# Patient Record
Sex: Female | Born: 1935 | Race: White | Hispanic: No | Smoking: Former smoker
Health system: Southern US, Community
[De-identification: ages and names within clinical notes are randomized; demographics above are authoritative.]

## PROBLEM LIST (undated history)

## (undated) DIAGNOSIS — Z950 Presence of cardiac pacemaker: Secondary | ICD-10-CM

## (undated) DIAGNOSIS — I509 Heart failure, unspecified: Secondary | ICD-10-CM

## (undated) DIAGNOSIS — C801 Malignant (primary) neoplasm, unspecified: Secondary | ICD-10-CM

## (undated) DIAGNOSIS — I4891 Unspecified atrial fibrillation: Secondary | ICD-10-CM

## (undated) DIAGNOSIS — E079 Disorder of thyroid, unspecified: Secondary | ICD-10-CM

## (undated) DIAGNOSIS — I10 Essential (primary) hypertension: Secondary | ICD-10-CM

## (undated) DIAGNOSIS — C50919 Malignant neoplasm of unspecified site of unspecified female breast: Secondary | ICD-10-CM

## (undated) DIAGNOSIS — Z923 Personal history of irradiation: Secondary | ICD-10-CM

## (undated) HISTORY — PX: BREAST SURGERY: SHX581

## (undated) HISTORY — PX: JOINT REPLACEMENT: SHX530

---

## 2008-05-27 HISTORY — PX: BREAST LUMPECTOMY: SHX2

## 2018-05-27 HISTORY — PX: BREAST BIOPSY: SHX20

## 2020-07-02 ENCOUNTER — Other Ambulatory Visit: Payer: Self-pay

## 2020-07-02 ENCOUNTER — Encounter (HOSPITAL_BASED_OUTPATIENT_CLINIC_OR_DEPARTMENT_OTHER): Payer: Self-pay | Admitting: Emergency Medicine

## 2020-07-02 ENCOUNTER — Emergency Department (HOSPITAL_BASED_OUTPATIENT_CLINIC_OR_DEPARTMENT_OTHER): Payer: Medicare Other

## 2020-07-02 ENCOUNTER — Observation Stay (HOSPITAL_BASED_OUTPATIENT_CLINIC_OR_DEPARTMENT_OTHER)
Admission: EM | Admit: 2020-07-02 | Discharge: 2020-07-02 | Disposition: A | Discharge status: Home / Self Care | Payer: Medicare Other | Attending: Cardiology | Admitting: Cardiology

## 2020-07-02 DIAGNOSIS — Z7982 Long term (current) use of aspirin: Secondary | ICD-10-CM | POA: Diagnosis not present

## 2020-07-02 DIAGNOSIS — Y710 Diagnostic and monitoring cardiovascular devices associated with adverse incidents: Secondary | ICD-10-CM | POA: Insufficient documentation

## 2020-07-02 DIAGNOSIS — Z79899 Other long term (current) drug therapy: Secondary | ICD-10-CM | POA: Insufficient documentation

## 2020-07-02 DIAGNOSIS — T82897A Other specified complication of cardiac prosthetic devices, implants and grafts, initial encounter: Secondary | ICD-10-CM | POA: Diagnosis present

## 2020-07-02 DIAGNOSIS — Z87891 Personal history of nicotine dependence: Secondary | ICD-10-CM | POA: Diagnosis not present

## 2020-07-02 DIAGNOSIS — R42 Dizziness and giddiness: Secondary | ICD-10-CM | POA: Insufficient documentation

## 2020-07-02 DIAGNOSIS — I110 Hypertensive heart disease with heart failure: Secondary | ICD-10-CM | POA: Insufficient documentation

## 2020-07-02 DIAGNOSIS — I455 Other specified heart block: Principal | ICD-10-CM

## 2020-07-02 DIAGNOSIS — R001 Bradycardia, unspecified: Secondary | ICD-10-CM | POA: Diagnosis not present

## 2020-07-02 DIAGNOSIS — T829XXA Unspecified complication of cardiac and vascular prosthetic device, implant and graft, initial encounter: Secondary | ICD-10-CM

## 2020-07-02 DIAGNOSIS — Z7901 Long term (current) use of anticoagulants: Secondary | ICD-10-CM | POA: Insufficient documentation

## 2020-07-02 DIAGNOSIS — I509 Heart failure, unspecified: Secondary | ICD-10-CM | POA: Insufficient documentation

## 2020-07-02 DIAGNOSIS — Z20822 Contact with and (suspected) exposure to covid-19: Secondary | ICD-10-CM | POA: Diagnosis not present

## 2020-07-02 DIAGNOSIS — I480 Paroxysmal atrial fibrillation: Secondary | ICD-10-CM | POA: Diagnosis not present

## 2020-07-02 HISTORY — DX: Malignant (primary) neoplasm, unspecified: C80.1

## 2020-07-02 HISTORY — DX: Unspecified atrial fibrillation: I48.91

## 2020-07-02 HISTORY — DX: Presence of cardiac pacemaker: Z95.0

## 2020-07-02 HISTORY — DX: Essential (primary) hypertension: I10

## 2020-07-02 HISTORY — DX: Disorder of thyroid, unspecified: E07.9

## 2020-07-02 HISTORY — DX: Heart failure, unspecified: I50.9

## 2020-07-02 LAB — CBC WITH DIFFERENTIAL/PLATELET
Abs Immature Granulocytes: 0.03 10*3/uL (ref 0.00–0.07)
Basophils Absolute: 0.1 10*3/uL (ref 0.0–0.1)
Basophils Relative: 1 %
Eosinophils Absolute: 0.2 10*3/uL (ref 0.0–0.5)
Eosinophils Relative: 1 %
HCT: 40.8 % (ref 36.0–46.0)
Hemoglobin: 14 g/dL (ref 12.0–15.0)
Immature Granulocytes: 0 %
Lymphocytes Relative: 17 %
Lymphs Abs: 1.7 10*3/uL (ref 0.7–4.0)
MCH: 31.7 pg (ref 26.0–34.0)
MCHC: 34.3 g/dL (ref 30.0–36.0)
MCV: 92.3 fL (ref 80.0–100.0)
Monocytes Absolute: 1 10*3/uL (ref 0.1–1.0)
Monocytes Relative: 9 %
Neutro Abs: 7.5 10*3/uL (ref 1.7–7.7)
Neutrophils Relative %: 72 %
Platelets: 162 10*3/uL (ref 150–400)
RBC: 4.42 MIL/uL (ref 3.87–5.11)
RDW: 13.4 % (ref 11.5–15.5)
WBC: 10.4 10*3/uL (ref 4.0–10.5)
nRBC: 0 % (ref 0.0–0.2)

## 2020-07-02 LAB — COMPREHENSIVE METABOLIC PANEL
ALT: 29 U/L (ref 0–44)
AST: 41 U/L (ref 15–41)
Albumin: 4.6 g/dL (ref 3.5–5.0)
Alkaline Phosphatase: 222 U/L — ABNORMAL HIGH (ref 38–126)
Anion gap: 11 (ref 5–15)
BUN: 24 mg/dL — ABNORMAL HIGH (ref 8–23)
CO2: 25 mmol/L (ref 22–32)
Calcium: 8.7 mg/dL — ABNORMAL LOW (ref 8.9–10.3)
Chloride: 99 mmol/L (ref 98–111)
Creatinine, Ser: 0.81 mg/dL (ref 0.44–1.00)
GFR, Estimated: >60 mL/min (ref >60)
Glucose, Bld: 107 mg/dL — ABNORMAL HIGH (ref 70–99)
Potassium: 3.9 mmol/L (ref 3.5–5.1)
Sodium: 135 mmol/L (ref 135–145)
Total Bilirubin: 0.9 mg/dL (ref 0.3–1.2)
Total Protein: 7.5 g/dL (ref 6.5–8.1)

## 2020-07-02 LAB — PROTIME-INR
INR: 1.3 — ABNORMAL HIGH (ref 0.8–1.2)
Prothrombin Time: 15.8 seconds — ABNORMAL HIGH (ref 11.4–15.2)

## 2020-07-02 LAB — TROPONIN I (HIGH SENSITIVITY)
Troponin I (High Sensitivity): 15 ng/L (ref <18)
Troponin I (High Sensitivity): 19 ng/L — ABNORMAL HIGH (ref <18)

## 2020-07-02 LAB — SARS CORONAVIRUS 2 BY RT PCR (HOSPITAL ORDER, PERFORMED IN ~~LOC~~ HOSPITAL LAB): SARS Coronavirus 2: NEGATIVE

## 2020-07-02 LAB — BRAIN NATRIURETIC PEPTIDE: B Natriuretic Peptide: 680.3 pg/mL — ABNORMAL HIGH (ref 0.0–100.0)

## 2020-07-02 MED ORDER — MAGNESIUM OXIDE 400 (241.3 MG) MG PO TABS: 400.0000 mg | ORAL_TABLET | Freq: Every day | ORAL | Status: DC

## 2020-07-02 MED ORDER — FLUTICASONE PROPIONATE 50 MCG/ACT NA SUSP: 1.0000 | Freq: Every day | NASAL | Status: DC | PRN

## 2020-07-02 MED ORDER — ATROPINE SULFATE 1 MG/10ML IJ SOSY
0.2500 mg | PREFILLED_SYRINGE | Freq: Once | INTRAMUSCULAR | Status: AC
  Administered 2020-07-02: 0.25 mg via INTRAVENOUS

## 2020-07-02 MED ORDER — PANTOPRAZOLE SODIUM 40 MG PO TBEC: 40.0000 mg | DELAYED_RELEASE_TABLET | Freq: Every day | ORAL | Status: DC

## 2020-07-02 MED ORDER — ALBUTEROL SULFATE HFA 108 (90 BASE) MCG/ACT IN AERS: 1.0000 | INHALATION_SPRAY | Freq: Four times a day (QID) | RESPIRATORY_TRACT | Status: DC | PRN

## 2020-07-02 MED ORDER — VITAMIN E 45 MG (100 UNIT) PO CAPS
400.0000 [IU] | ORAL_CAPSULE | Freq: Every day | ORAL | Status: DC
  Filled 2020-07-02: qty 4

## 2020-07-02 MED ORDER — ASPIRIN 81 MG PO CHEW: 81.0000 mg | CHEWABLE_TABLET | Freq: Every day | ORAL | Status: DC

## 2020-07-02 MED ORDER — ZOLPIDEM TARTRATE 5 MG PO TABS: 5.0000 mg | ORAL_TABLET | Freq: Every evening | ORAL | Status: DC | PRN

## 2020-07-02 MED ORDER — LORAZEPAM 1 MG PO TABS: 0.5000 mg | ORAL_TABLET | Freq: Every day | ORAL | Status: DC | PRN

## 2020-07-02 MED ORDER — CALCIUM CARBONATE-VITAMIN D 500-200 MG-UNIT PO TABS
1.0000 | ORAL_TABLET | Freq: Two times a day (BID) | ORAL | Status: DC
  Filled 2020-07-02: qty 1

## 2020-07-02 MED ORDER — HEPARIN (PORCINE) 25000 UT/250ML-% IV SOLN
750.0000 [IU]/h | INTRAVENOUS | Status: DC
  Filled 2020-07-02: qty 250

## 2020-07-02 MED ORDER — ATORVASTATIN CALCIUM 10 MG PO TABS: 20.0000 mg | ORAL_TABLET | Freq: Every day | ORAL | Status: DC

## 2020-07-02 MED ORDER — ONDANSETRON HCL 4 MG/2ML IJ SOLN: 4.0000 mg | Freq: Four times a day (QID) | INTRAMUSCULAR | Status: DC | PRN

## 2020-07-02 MED ORDER — LEVOTHYROXINE SODIUM 50 MCG PO TABS
50.0000 ug | ORAL_TABLET | Freq: Every day | ORAL | Status: DC
  Administered 2020-07-02: 50 ug via ORAL
  Filled 2020-07-02: qty 1

## 2020-07-02 MED ORDER — LEVOTHYROXINE SODIUM 50 MCG PO TABS: 50.0000 ug | ORAL_TABLET | Freq: Every day | ORAL | Status: DC

## 2020-07-02 MED ORDER — VITAMIN B-12 100 MCG PO TABS: 100.0000 ug | ORAL_TABLET | ORAL | Status: DC

## 2020-07-02 MED ORDER — ATROPINE SULFATE 1 MG/10ML IJ SOSY
PREFILLED_SYRINGE | INTRAMUSCULAR | Status: AC
  Administered 2020-07-02: 0.5 mg
  Filled 2020-07-02: qty 10

## 2020-07-02 MED ORDER — ATROPINE SULFATE 1 MG/10ML IJ SOSY: 0.5000 mg | PREFILLED_SYRINGE | Freq: Once | INTRAMUSCULAR | Status: AC

## 2020-07-02 MED ORDER — ACETAMINOPHEN 325 MG PO TABS: 650.0000 mg | ORAL_TABLET | ORAL | Status: DC | PRN

## 2020-07-02 MED ORDER — GLUCAGON HCL RDNA (DIAGNOSTIC) 1 MG IJ SOLR
1.0000 mg | Freq: Once | INTRAMUSCULAR | Status: AC
  Administered 2020-07-02: 1 mg via INTRAVENOUS
  Filled 2020-07-02: qty 1

## 2020-07-02 NOTE — ED Provider Notes (Addendum)
[facility] EMERGENCY DEPARTMENT Provider Note   CSN: [id number] Arrival date & time: 07/02/20  0027     History Chief Complaint  Patient presents with  . Pacemaker Problem    Rebecca Torres is a 87 y.o. female.  The history is provided by the patient.  Illness Location:  Pacermaker  Quality:  Missed beats since Friday.  Patient reports she initiatally had this same issue prior to pacer placement in 2013, but it restarted Friday.  Then eased up and this am restarted frequently.   Severity:  Severe Onset quality:  Gradual Duration:  2 days Timing:  Intermittent Progression:  Worsening Chronicity:  Recurrent Context:  Biotronic pacemaker placed in [place], [place] in 2013  Relieved by:  Nothing  Worsened by:  Time  Ineffective treatments:  None tried  Associated symptoms: fatigue   Associated symptoms: no abdominal pain, no chest pain, no congestion, no cough, no diarrhea, no ear pain, no fever, no headaches, no rash, no rhinorrhea, no shortness of breath, no sore throat and no vomiting   Associated symptoms comment:  Fatigue with pauses  Risk factors:  Elderly patient with pacemaker  Patient with AFIB on Eliquis and a pacemaker since 2013 presents with pauses in her pulse since Friday.  No trauma. No associated CP or SOB, but feels fatigued and poorly when the pauses happen.  Patient reports she is from [place] but lived in [place], [place] for the last 68 years and has recently moved to [place].       Past Medical History:  Diagnosis Date  . A-fib ([place])   . Cancer (HCC)    breast  . CHF (congestive heart failure) ([place])   . Hypertension   . Pacemaker   . Thyroid disease     There are no problems to display for this patient.   Past Surgical History:  Procedure Laterality Date  . BREAST SURGERY Left    lumpectomy  . JOINT REPLACEMENT     left knee     OB History   No obstetric history on file.     History reviewed. No pertinent family  history.  Social History   Tobacco Use  . Smoking status: Former Research scientist (life sciences)  . Smokeless tobacco: Never Used  Substance Use Topics  . Alcohol use: Never  . Drug use: Never    Home Medications Prior to Admission medications   Medication Sig Start Date End Date Taking? Authorizing Provider  atorvastatin (LIPITOR) 20 MG tablet Take 20 mg by mouth daily. 06/19/20   [provider]  ELIQUIS 2.5 MG TABS tablet Take 2.5 mg by mouth 2 (two) times daily. 06/19/20   [provider]  eszopiclone (LUNESTA) 2 MG TABS tablet Take 2 mg by mouth at bedtime as needed. 05/16/20   [provider]  furosemide (LASIX) 20 MG tablet Take 20 mg by mouth 2 (two) times daily. 03/30/20   [provider]  levothyroxine (SYNTHROID) 50 MCG tablet  05/08/20   [provider]  LORazepam (ATIVAN) 0.5 MG tablet Take 0.5 mg by mouth daily as needed. 03/09/20   [provider]  metoprolol succinate (TOPROL-XL) 50 MG 24 hr tablet Take 50 mg by mouth 2 (two) times daily. 05/30/20   [provider]  omeprazole (PRILOSEC) 20 MG capsule Take 20 mg by mouth daily. 03/09/20   [provider]  spironolactone (ALDACTONE) 25 MG tablet Take 25 mg by mouth daily. 03/30/20   [provider]    Allergies  Codeine  Review of Systems   Review of Systems  Constitutional: Positive for fatigue. Negative for fever.  HENT: Negative for congestion, ear pain, rhinorrhea and sore throat.   Respiratory: Negative for cough and shortness of breath.   Cardiovascular: Negative for chest pain.       Sinus pauses, pauses in pacing   Gastrointestinal: Negative for abdominal pain, diarrhea and vomiting.  Genitourinary: Negative for difficulty urinating.  Musculoskeletal: Negative for neck pain.  Skin: Negative for rash.  Neurological: Negative for headaches.  Psychiatric/Behavioral: Negative for agitation.  All other systems reviewed and are negative.   Physical  Exam Updated Vital Signs BP (!) 171/94   Pulse 89   Temp 98.3 F (36.8 C) (Oral)   Resp 19   Ht 5' 2.5" (1.588 m)   Wt 53.5 kg   SpO2 97%   BMI 21.24 kg/m   Physical Exam Vitals and nursing note reviewed.  Constitutional:      Appearance: Normal appearance. She is not diaphoretic.  HENT:     Head: Normocephalic and atraumatic.     Nose: Nose normal.  Eyes:     Extraocular Movements: Extraocular movements intact.     Conjunctiva/sclera: Conjunctivae normal.  Cardiovascular:     Rate and Rhythm: Normal rate and regular rhythm.     Pulses: Normal pulses.     Heart sounds: Normal heart sounds.  Pulmonary:     Effort: Pulmonary effort is normal.     Breath sounds: Normal breath sounds.  Abdominal:     General: Abdomen is flat. Bowel sounds are normal.     Palpations: Abdomen is soft.     Tenderness: There is no abdominal tenderness. There is no guarding.  Musculoskeletal:        General: Normal range of motion.     Cervical back: Normal range of motion and neck supple.  Skin:    General: Skin is warm and dry.     Capillary Refill: Capillary refill takes less than 2 seconds.  Neurological:     General: No focal deficit present.     Mental Status: She is alert and oriented to person, place, and time.     Deep Tendon Reflexes: Reflexes normal.  Psychiatric:        Mood and Affect: Mood normal.        Behavior: Behavior normal.        Thought Content: Thought content normal.     ED Results / Procedures / Treatments   Labs (all labs ordered are listed, but only abnormal results are displayed) Results for orders placed or performed during the hospital encounter of 07/02/20  CBC with Differential/Platelet  Result Value Ref Range   WBC 10.4 4.0 - 10.5 K/uL   RBC 4.42 3.87 - 5.11 MIL/uL   Hemoglobin 14.0 12.0 - 15.0 g/dL   HCT 40.8 36.0 - 46.0 %   MCV 92.3 80.0 - 100.0 fL   MCH 31.7 26.0 - 34.0 pg   MCHC 34.3 30.0 - 36.0 g/dL   RDW 13.4 11.5 - 15.5 %   Platelets 162  150 - 400 K/uL   nRBC 0.0 0.0 - 0.2 %   Neutrophils Relative % 72 %   Neutro Abs 7.5 1.7 - 7.7 K/uL   Lymphocytes Relative 17 %   Lymphs Abs 1.7 0.7 - 4.0 K/uL   Monocytes Relative 9 %   Monocytes Absolute 1.0 0.1 - 1.0 K/uL   Eosinophils Relative 1 %   Eosinophils Absolute 0.2  0.0 - 0.5 K/uL   Basophils Relative 1 %   Basophils Absolute 0.1 0.0 - 0.1 K/uL   Immature Granulocytes 0 %   Abs Immature Granulocytes 0.03 0.00 - 0.07 K/uL  Comprehensive metabolic panel  Result Value Ref Range   Sodium 135 135 - 145 mmol/L   Potassium 3.9 3.5 - 5.1 mmol/L   Chloride 99 98 - 111 mmol/L   CO2 25 22 - 32 mmol/L   Glucose, Bld 107 (H) 70 - 99 mg/dL   BUN 24 (H) 8 - 23 mg/dL   Creatinine, Ser 0.81 0.44 - 1.00 mg/dL   Calcium 8.7 (L) 8.9 - 10.3 mg/dL   Total Protein 7.5 6.5 - 8.1 g/dL   Albumin 4.6 3.5 - 5.0 g/dL   AST 41 15 - 41 U/L   ALT 29 0 - 44 U/L   Alkaline Phosphatase 222 (H) 38 - 126 U/L   Total Bilirubin 0.9 0.3 - 1.2 mg/dL   GFR, Estimated >60 >60 mL/min   Anion gap 11 5 - 15  Protime-INR  Result Value Ref Range   Prothrombin Time 15.8 (H) 11.4 - 15.2 seconds   INR 1.3 (H) 0.8 - 1.2  Troponin I (High Sensitivity)  Result Value Ref Range   Troponin I (High Sensitivity) 15 <18 ng/L   DG Chest Portable 1 View  Result Date: 07/02/2020 CLINICAL DATA:  Pacemaker issue EXAM: PORTABLE CHEST 1 VIEW COMPARISON:  10/14/2019 FINDINGS: Right pacer remains in place, unchanged. Cardiomegaly. Aortic atherosclerosis. No confluent opacities or effusions. No acute bony abnormality. IMPRESSION: Cardiomegaly.  No active disease. Electronically Signed   By: Rolm Baptise M.D.   On: 07/02/2020 01:22    EKG EKG Interpretation  Date/Time:  Sunday July 02 2020 00:33:09 EST Ventricular Rate:  93 PR Interval:    QRS Duration: 168 QT Interval:  438 QTC Calculation: 545 R Axis:   -83 Text Interpretation: Accelerated junctional rhythm IVCD, consider atypical RBBB LVH with secondary  repolarization abnormality Confirmed by Yeny Schmoll ([id number]) on 07/02/2020 12:39:05 AM prior to noted pauses in the ED   Radiology DG Chest Portable 1 View  Result Date: 07/02/2020 CLINICAL DATA:  Pacemaker issue EXAM: PORTABLE CHEST 1 VIEW COMPARISON:  10/14/2019 FINDINGS: Right pacer remains in place, unchanged. Cardiomegaly. Aortic atherosclerosis. No confluent opacities or effusions. No acute bony abnormality. IMPRESSION: Cardiomegaly.  No active disease. Electronically Signed   By: Kevin  Dover M.D.   On: 07/02/2020 01:22    Procedures Procedures   Medications Ordered in ED Medications  atropine 1 MG/10ML injection 0.5 mg (0.5 mg Intravenous Given 07/02/20 0122)    ED Course  I have reviewed the triage vital signs and the nursing notes.  Pertinent labs & imaging results that were available during my care of the patient were reviewed by me and considered in my medical decision making (see chart for details).   Patient presented to the ED for pauses in pacing.  During evaluation, patient see on monitor to go into a prolonged pause without p waves or pacer spikes.  Cardiology consulted and Decision made to given 0.5 mg of atropine with marked increase in rate to the 90s. Patient then began to pace but lost capture with every other beat.  There are no electrolyte abnormalities.  No signs of lead fracture on imaging.  First troponin is normal.  Copies of chart have been made strips.     12 0 Case d/w Cardiology fellow, Dr. Shirlee Latch by phone, no beds.  Will transfer  ED to ED.  Please page cardiology on Arrival   MDM Reviewed: nursing note and vitals Interpretation: labs, ECG and x-ray (elevated glucose, normal potassium, BUN and creatinine, normal first troponin, Cardiomegaly without lead fracture by me on CXR ) Total time providing critical care: 30-74 minutes (time at the bedside, atropine due to pauses ). This excludes time spent performing separately reportable procedures and  services. Consults: cardiology (EDP at [facility] )  CRITICAL CARE Performed by: Reilynn Lauro K Ruddy Swire-Rasch Total critical care time: 60 minutes Critical care time was exclusive of separately billable procedures and treating other patients. Critical care was necessary to treat or prevent imminent or life-threatening deterioration. Critical care was time spent personally by me on the following activities: development of treatment plan with patient and/or surrogate as well as nursing, discussions with consultants, evaluation of patient's response to treatment, examination of patient, obtaining history from patient or surrogate, ordering and performing treatments and interventions, ordering and review of laboratory studies, ordering and review of radiographic studies, pulse oximetry and re-evaluation of patient's condition.  Rebecca Torres was evaluated in Emergency Department on 07/02/2020 for the symptoms described in the history of present illness. She was evaluated in the context of the global COVID-19 pandemic, which necessitated consideration that the patient might be at risk for infection with the SARS-CoV-2 virus that causes COVID-19. Institutional protocols and algorithms that pertain to the evaluation of patients at risk for COVID-19 are in a state of rapid change based on information released by regulatory bodies including the CDC and federal and state organizations. These policies and algorithms were followed during the patient's care in the ED.   Given .25 mg of atropine prior to transport in order to maintain HR en route to [facility].  [facility] advised to go Emergency traffic [facility] by EDP and expressed understanding of this.   Final Clinical Impression(s) / ED Diagnoses Final diagnoses:  Sinus pause   Accepted by Dr. Cassandria Anger to the ED at [facility].  Cardiology to be paged upon arrival        [facility], Renezmae Canlas, MD 07/02/20 [id number]

## 2020-07-02 NOTE — ED Notes (Signed)
EDP ordered [facility] travel emergency traffic

## 2020-07-02 NOTE — ED Notes (Signed)
Verified with Dr that patient ok to discharge.

## 2020-07-02 NOTE — ED Notes (Signed)
EDP ordered acuity level increase

## 2020-07-02 NOTE — ED Notes (Signed)
EDP at bedside  

## 2020-07-02 NOTE — ED Notes (Signed)
Pt daughter at bedside, informed of transfer to [facility] ED; verbalized knowledge of location

## 2020-07-02 NOTE — ED Notes (Signed)
Pacer pads placed per EDP 

## 2020-07-02 NOTE — H&P (Addendum)
Cardiology Admission History and Physical:   Patient ID: Rebecca Torres MRN: [id number]; DOB: June 03, 1929   Admission date: 07/02/2020  Primary Care Provider: [place], [place] Primary Cardiologist: No primary care provider on file.  Primary Electrophysiologist:  None   Chief Complaint:  lightheadedness x 3 days  Patient Profile:   Rebecca Torres is a 87 y.o. female with history of pacemaker for sinus node dysfunction per patient in 2013, paroxysmal atrial fibrillation, relative recent diagnosis in the last 2 months of heart failure.  History of Present Illness:   Rebecca Torres is a pleasant 87 year old lady presenting with lightheadedness and dizziness and irregular beats since 2 or 3 days ago.  She states she does not normally check her heart rate at home and does not know what it meant however when she went to get checked at the ED at [facility] emergency department she was found to have a slow heart rate in the 40s concerning for junctional rhythm with wide QRS complex.  There was reported long pauses as long as 8 and 10 seconds and she did receive atropine x2 with good rapid responses.  She had transcutaneous pacing pads placed but never was paced reportedly.  Otherwise she is doing well without complaint.  At the bedside he is denying any shortness of breath lower extremity edema or recurrent dizziness.   Past Medical History:  Diagnosis Date  . A-fib ([place])   . Cancer (HCC)    breast  . CHF (congestive heart failure) ([place])   . Hypertension   . Pacemaker   . Thyroid disease     Past Surgical History:  Procedure Laterality Date  . BREAST SURGERY Left    lumpectomy  . JOINT REPLACEMENT     left knee     Medications Prior to Admission: Prior to Admission medications   Medication Sig Start Date End Date Taking? Authorizing Provider  albuterol (VENTOLIN HFA) 108 (90 Base) MCG/ACT inhaler Inhale 1 puff into the lungs every 6 (six) hours as needed for  wheezing or shortness of breath.   Yes [provider]  amLODipine (NORVASC) 2.5 MG tablet Take 2.5 mg by mouth daily. 12/06/19  Yes [provider]  aspirin 81 MG EC tablet Take 81 mg by mouth daily.   Yes [provider]  atorvastatin (LIPITOR) 20 MG tablet Take 20 mg by mouth daily. 06/19/20  Yes [provider]  calcium-vitamin D (OSCAL WITH D) 500-200 MG-UNIT TABS tablet Take 1 tablet by mouth 2 (two) times daily.   Yes [provider]  ELIQUIS 2.5 MG TABS tablet Take 2.5 mg by mouth 2 (two) times daily. 06/19/20  Yes [provider]  eszopiclone (LUNESTA) 2 MG TABS tablet Take 2 mg by mouth at bedtime as needed for sleep. 05/16/20  Yes [provider]  fluticasone (FLONASE) 50 MCG/ACT nasal spray Place 1 spray into the nose daily as needed for allergies.   Yes [provider]  furosemide (LASIX) 20 MG tablet Take 40 mg by mouth daily. 03/30/20  Yes [provider]  levothyroxine (SYNTHROID) 50 MCG tablet Take 50 mcg by mouth daily before breakfast. 05/08/20  Yes [provider]  LORazepam (ATIVAN) 0.5 MG tablet Take 0.5 mg by mouth daily as needed for anxiety. 03/09/20  Yes [provider]  magnesium oxide (MAG-OX) 400 MG tablet Take 400 mg by mouth daily. 08/17/19  Yes [provider]  metoprolol succinate (TOPROL-XL) 50 MG 24 hr tablet Take 50  mg by mouth 2 (two) times daily. 05/30/20  Yes [provider]  omeprazole (PRILOSEC) 20 MG capsule Take 20 mg by mouth daily. 03/09/20  Yes [provider]  spironolactone (ALDACTONE) 25 MG tablet Take 12.5 mg by mouth daily. 03/30/20  Yes [provider]  vitamin B-12 (CYANOCOBALAMIN) 500 MCG tablet Take by mouth every Monday, Wednesday, and Friday.   Yes [provider]  vitamin E 180 MG (400 UNITS) capsule Take 400 Units by mouth daily.   Yes [provider]     Allergies:    Allergies  Allergen  Reactions  . Amiodarone     Other reaction(s): Other (See Comments) Depression, Retinal Deposits  . Cephalosporins Itching  . Codeine   . Lisinopril     Other reaction(s): Cough (ALLERGY/intolerance)  . Nitrofurantoin Other (See Comments)    Hair loss, cramping    Social History:   Social History   Socioeconomic History  . Marital status: Widowed    Spouse name: Not on file  . Number of children: Not on file  . Years of education: Not on file  . Highest education level: Not on file  Occupational History  . Not on file  Tobacco Use  . Smoking status: Former Research scientist (life sciences)  . Smokeless tobacco: Never Used  Substance and Sexual Activity  . Alcohol use: Never  . Drug use: Never  . Sexual activity: Not on file  Other Topics Concern  . Not on file  Social History Narrative  . Not on file   Social Determinants of Health   Financial Resource Strain: Not on file  Food Insecurity: Not on file  Transportation Needs: Not on file  Physical Activity: Not on file  Stress: Not on file  Social Connections: Not on file  Intimate Partner Violence: Not on file    Family History: No family history of pacemaker issues The patient's family history is not on file.     Review of Systems: [y] = yes, [ ]  = no   . General: Weight gain [ ] ; Weight loss [ ] ; Anorexia [ ] ; Fatigue [ ] ; Fever [ ] ; Chills [ ] ; Weakness [ ]   . Cardiac: Chest pain/pressure [ ] ; Resting SOB [ ] ; Exertional SOB [ ] ; Orthopnea [ ] ; Pedal Edema [ ] ; Palpitations Blue.Reese ]; Syncope [ ] ; Presyncope Blue.Reese ]; Paroxysmal nocturnal dyspnea[ ]   . Pulmonary: Cough [ ] ; Wheezing[ ] ; Hemoptysis[ ] ; Sputum [ ] ; Snoring [ ]   . GI: Vomiting[ ] ; Dysphagia[ ] ; Melena[ ] ; Hematochezia [ ] ; Heartburn[ ] ; Abdominal pain [ ] ; Constipation [ ] ; Diarrhea [ ] ; BRBPR [ ]   . GU: Hematuria[ ] ; Dysuria [ ] ; Nocturia[ ]   . Vascular: Pain in legs with walking [ ] ; Pain in feet with lying flat [ ] ; Non-healing sores [ ] ; Stroke [ ] ; TIA [ ] ; Slurred speech [  ];  . Neuro: Headaches[ ] ; Vertigo[ ] ; Seizures[ ] ; Paresthesias[ ] ;Blurred vision [ ] ; Diplopia [ ] ; Vision changes [ ]   . Ortho/Skin: Arthritis [ ] ; Joint pain [ ] ; Muscle pain [ ] ; Joint swelling [ ] ; Back Pain [ ] ; Rash [ ]   . Psych: Depression[ ] ; Anxiety[ ]   . Heme: Bleeding problems [ ] ; Clotting disorders [ ] ; Anemia [ ]   . Endocrine: Diabetes [ ] ; Thyroid dysfunction[ ]   Physical Exam/Data:   Vitals:   07/02/20 0345 07/02/20 0410 07/02/20 0430 07/02/20 0500  BP: (!) 126/52 125/61 (!) 110/95 130/60  Pulse: (!) 32 (!) 38 79 Marland Kitchen)  36  Resp: 14 13 20  (!) 21  Temp:      TempSrc:      SpO2: 98% 97% 96% 98%  Weight:      Height:       No intake or output data in the 24 hours ending 07/02/20 0513 Filed Weights   07/02/20 0035 07/02/20 0247  Weight: 53.5 kg 53.5 kg   Body mass index is 21.23 kg/m.  General:  Well nourished, well developed, in no acute distress HEENT: normal Lymph: no adenopathy Neck: no JVD Endocrine:  No thryomegaly Vascular: No carotid bruits; FA pulses 2+ bilaterally without bruits  Cardiac: Slow and regular rhythm and rate, S1, S2; RRR; 2 out of 6 systolic murmur  Lungs:  clear to auscultation bilaterally, no wheezing, rhonchi or rales  Abd: soft, nontender, no hepatomegaly  Ext: no edema Musculoskeletal:  No deformities, BUE and BLE strength normal and equal Skin: warm and dry  Neuro:  no focal abnormalities noted Psych:  Normal affect    EKG:  The ECG that was done  was personally reviewed and demonstrates ventricular junctional escape rhythm  Relevant CV Studies:   Laboratory Data:  Chemistry Recent Labs  Lab 07/02/20 0111  NA 135  K 3.9  CL 99  CO2 25  GLUCOSE 107*  BUN 24*  CREATININE 0.81  CALCIUM 8.7*  GFRNONAA >60  ANIONGAP 11    Recent Labs  Lab 07/02/20 0111  PROT 7.5  ALBUMIN 4.6  AST 41  ALT 29  ALKPHOS 222*  BILITOT 0.9   Hematology Recent Labs  Lab 07/02/20 0111  WBC 10.4  RBC 4.42  HGB 14.0  HCT 40.8   MCV 92.3  MCH 31.7  MCHC 34.3  RDW 13.4  PLT 162   Cardiac EnzymesNo results for input(s): TROPONINI in the last 168 hours. No results for input(s): TROPIPOC in the last 168 hours.  BNPNo results for input(s): BNP, PROBNP in the last 168 hours.  DDimer No results for input(s): DDIMER in the last 168 hours.  Radiology/Studies:  DG Chest Portable 1 View  Result Date: 07/02/2020 CLINICAL DATA:  Pacemaker issue EXAM: PORTABLE CHEST 1 VIEW COMPARISON:  10/14/2019 FINDINGS: Right pacer remains in place, unchanged. Cardiomegaly. Aortic atherosclerosis. No confluent opacities or effusions. No acute bony abnormality. IMPRESSION: Cardiomegaly.  No active disease. Electronically Signed   By: Rolm Baptise M.D.   On: 07/02/2020 01:22    Assessment and Plan:   1. Symptomatic bradycardia  Concern for pacemaker dysfunction versus ERI 2. History of sinus node dysfunction status post Biotronik dual-chamber pacemaker 3. Congestive heart failure unknown EF  Has been on Lasix and prolactin outpatient 4. Paroxysmal atrial fibrillation  -Hold metoprolol XL and will give glucagon. -We will hold Eliquis and start heparin. -We hold other antihypertensives including spironolactone and Lasix. -Maintain transcutaneous pacing pads on.  Atropine as needed and dopamine if ineffective for dynamically unstable. -Echocardiogram  -Clear liquid diet for now. -Evaluation by EP  Severity of Illness: The appropriate patient status for this patient is INPATIENT. Inpatient status is judged to be reasonable and necessary in order to provide the required intensity of service to ensure the patient's safety. The patient's presenting symptoms, physical exam findings, and initial radiographic and laboratory data in the context of their chronic comorbidities is felt to place them at high risk for further clinical deterioration. Furthermore, it is not anticipated that the patient will be medically stable for discharge from the  hospital within 2 midnights of admission.  The following factors support the patient status of inpatient.   " The patient's presenting symptoms include dizziness, lightheadedness, palpitations. " The worrisome physical exam findings include marked bradycardia. " The initial radiographic and laboratory data are worrisome because of wide-complex QRS and history of pacemaker. " The chronic co-morbidities include heart failure.   * I certify that at the point of admission it is my clinical judgment that the patient will require inpatient hospital care spanning beyond 2 midnights from the point of admission due to high intensity of service, high risk for further deterioration and high frequency of surveillance required.*    For questions or updates, please contact [place] Please consult [email or web address] for contact info under        Signed, Chancy Milroy, MD  07/02/2020 5:13 AM

## 2020-07-02 NOTE — ED Notes (Signed)
Lunch Tray Ordered @ 1105. 

## 2020-07-02 NOTE — ED Notes (Signed)
Verbal order atropine 0.5mg 

## 2020-07-02 NOTE — Care Management CC44 (Signed)
Condition Code 44 Documentation Completed  Patient Details  Name: Rebecca Torres MRN: [id number] Date of Birth: 1930/01/01   Condition Code 44 given:    Patient signature on Condition Code 44 notice:    Documentation of 2 MD's agreement:    Code 44 added to claim:       Laurena Slimmer, RN 07/02/2020, 10:26 AM

## 2020-07-02 NOTE — ED Notes (Signed)
Cardiology at bedside.

## 2020-07-02 NOTE — Discharge Summary (Signed)
Discharge Summary    Patient ID: Rebecca Torres MRN: [id number]; DOB: April 29, 1930  Admit date: 07/02/2020 Discharge date: 07/02/2020  Primary Care Provider: Sandy Level  Primary Cardiologist: Dr. Phillis Haggis [facility] Primary Electrophysiologist: Dr. Phillis Haggis [facility]  Discharge Diagnoses    Active Problems:   Symptomatic bradycardia   Sinus pause    Diagnostic Studies/Procedures    Device interrogation  _____________   History of Present Illness     Rebecca Torres is a 87 y.o. female with PMH of sinus node dysfunction s/p PPM, PAF and CHF who presented with lightheadedness and dizziness and irregular beats since 2 or 3 days ago.  She stated she does not normally check her heart rate at home but had been feeling like she was "skipping beats, having pauses" She was brought to be checked at the ED at [facility] emergency department she was found to have a slow heart rate in the 40s concerning for junctional rhythm with wide QRS complex.  There was reported long pauses as long as 8 and 10 seconds and she did receive atropine x2 with good rapid responses.  She had transcutaneous pacing pads placed but never was paced reportedly. Case was discussed with cardiology and she was transferred to [facility] for further management.  Otherwise she was doing well without complaint at the time of assessment on arrival from [facility]. She was admitted to cardiology for further management and formal EP evaluation.   Hospital Course     Consultants: EP   She was seen by Dr. Quentin Ore the following morning with pacemaker interrogation reviewed with V threshold 1.5V at 86ms, device was reprogrammed with output to 3V at 52ms. New longevity estimated at 1y63m. Per Dr. Quentin Ore the review of her threshold trends noted the V lead had a chronically elevated capture threshold at 1-1.5V at 0.14ms but it has recently increased to near 2-2.5V at 0.44ms which has caused intermittent noncapture given the device was  programmed to deliver 2.5V at 0.39ms. Her CXR showed good lead position without any obvious fractures to lead components.   After programing she was noted to be back to her baseline and felt well . It was felt she was stable for discharge home with close follow up in her PCP cardiologist office within a week for another in person device interrogation. Will arrange for "back up" follow up with Dr. Quentin Ore in the event she is unable to get into her cardiologist within appropriate time period. She was given precautions of when to return to the ED per Dr. Quentin Ore prior to discharge.    Did the patient have an acute coronary syndrome (MI, NSTEMI, STEMI, etc) this admission?:  No                               Did the patient have a percutaneous coronary intervention (stent / angioplasty)?:  No.       _____________  Discharge Vitals Blood pressure (!) 147/76, pulse 73, temperature 98.1 F (36.7 C), temperature source Oral, resp. rate 17, height 5' 2.5" (1.588 m), weight 53.5 kg, SpO2 99 %.  Filed Weights   07/02/20 0035 07/02/20 0247  Weight: 53.5 kg 53.5 kg    Labs & Radiologic Studies    CBC Recent Labs    07/02/20 0111  WBC 10.4  NEUTROABS 7.5  HGB 14.0  HCT 40.8  MCV 92.3  PLT 376   Basic Metabolic Panel Recent Labs  07/02/20 0111  NA 135  K 3.9  CL 99  CO2 25  GLUCOSE 107*  BUN 24*  CREATININE 0.81  CALCIUM 8.7*   Liver Function Tests Recent Labs    07/02/20 0111  AST 41  ALT 29  ALKPHOS 222*  BILITOT 0.9  PROT 7.5  ALBUMIN 4.6   No results for input(s): LIPASE, AMYLASE in the last 72 hours. High Sensitivity Troponin:   Recent Labs  Lab 07/02/20 0111 07/02/20 0256  TROPONINIHS 15 19*    BNP Invalid input(s): POCBNP D-Dimer No results for input(s): DDIMER in the last 72 hours. Hemoglobin A1C No results for input(s): HGBA1C in the last 72 hours. Fasting Lipid Panel No results for input(s): CHOL, HDL, LDLCALC, TRIG, CHOLHDL, LDLDIRECT in the last 72  hours. Thyroid Function Tests No results for input(s): TSH, T4TOTAL, T3FREE, THYROIDAB in the last 72 hours.  Invalid input(s): FREET3 _____________  DG Chest Portable 1 View  Result Date: 07/02/2020 CLINICAL DATA:  Pacemaker issue EXAM: PORTABLE CHEST 1 VIEW COMPARISON:  10/14/2019 FINDINGS: Right pacer remains in place, unchanged. Cardiomegaly. Aortic atherosclerosis. No confluent opacities or effusions. No acute bony abnormality. IMPRESSION: Cardiomegaly.  No active disease. Electronically Signed   By: Rolm Baptise M.D.   On: 07/02/2020 01:22   Disposition   Pt is being discharged home today in good condition.  Follow-up Plans & Appointments     Follow-up Information    Mahala Menghini, MD Follow up.   Specialty: Cardiology Why: Please be seen by your cardiologist within one week for follow up!! Contact information: [address] [place] [address] [phone]              Discharge Instructions    Diet - low sodium heart healthy   Complete by: As directed    Increase activity slowly   Complete by: As directed       Discharge Medications   Allergies as of 07/02/2020      Reactions   Amiodarone    Other reaction(s): Other (See Comments) Depression, Retinal Deposits   Cephalosporins Itching   Codeine    Lisinopril    Other reaction(s): Cough (ALLERGY/intolerance)   Nitrofurantoin Other (See Comments)   Hair loss, cramping      Medication List    TAKE these medications   albuterol 108 (90 Base) MCG/ACT inhaler Commonly known as: VENTOLIN HFA Inhale 1 puff into the lungs every 6 (six) hours as needed for wheezing or shortness of breath.   amLODipine 2.5 MG tablet Commonly known as: NORVASC Take 2.5 mg by mouth daily.   aspirin 81 MG EC tablet Take 81 mg by mouth daily.   atorvastatin 20 MG tablet Commonly known as: LIPITOR Take 20 mg by mouth daily.   calcium-vitamin D 500-200 MG-UNIT Tabs tablet Commonly known as: OSCAL WITH  D Take 1 tablet by mouth 2 (two) times daily.   Eliquis 2.5 MG Tabs tablet Generic drug: apixaban Take 2.5 mg by mouth 2 (two) times daily.   eszopiclone 2 MG Tabs tablet Commonly known as: LUNESTA Take 2 mg by mouth at bedtime as needed for sleep.   fluticasone 50 MCG/ACT nasal spray Commonly known as: FLONASE Place 1 spray into the nose daily as needed for allergies.   furosemide 20 MG tablet Commonly known as: LASIX Take 40 mg by mouth daily.   levothyroxine 50 MCG tablet Commonly known as: SYNTHROID Take 50 mcg by mouth daily before breakfast.   LORazepam 0.5 MG  tablet Commonly known as: ATIVAN Take 0.5 mg by mouth daily as needed for anxiety.   magnesium oxide 400 MG tablet Commonly known as: MAG-OX Take 400 mg by mouth daily.   metoprolol succinate 50 MG 24 hr tablet Commonly known as: TOPROL-XL Take 50 mg by mouth 2 (two) times daily.   omeprazole 20 MG capsule Commonly known as: PRILOSEC Take 20 mg by mouth daily.   spironolactone 25 MG tablet Commonly known as: ALDACTONE Take 12.5 mg by mouth daily.   vitamin B-12 500 MCG tablet Commonly known as: CYANOCOBALAMIN Take by mouth every Monday, Wednesday, and Friday.   vitamin E 180 MG (400 UNITS) capsule Take 400 Units by mouth daily.       Outstanding Labs/Studies   N/a   Duration of Discharge Encounter   Greater than 30 minutes including physician time.  Signed, Reino Bellis, NP 07/02/2020, 9:54 AM

## 2020-07-02 NOTE — ED Triage Notes (Signed)
Patient arrives with [facility] from [facility], patient presented after feeling pauses with her pacemaker, denies chest pain and shortness of breath.

## 2020-07-02 NOTE — ED Notes (Signed)
Attempted IV in left forearm without success. Second attempt successful.

## 2020-07-02 NOTE — ED Provider Notes (Signed)
Patient arrives from [facility] with pacemaker failure.  Noted to have a breakthrough junctional rhythm.  She is symptomatic with capitation's.  Also has reported some intermittent shortness of breath.  Per [facility], she did not require pacing in route but was noted to have heart rate 35-45.  On my evaluation she is asymptomatic but states she felt poorly 5 to 10 minutes ago.  Heart rate is 42.  Junctional rhythm noted on the monitor.  Will consult with cardiology.   Physical Exam  BP 130/69 (BP Location: Right Arm)   Pulse (!) 49   Temp 98.1 F (36.7 C) (Oral)   Resp 17   Ht 1.588 m (5' 2.5")   Wt 53.5 kg   SpO2 100%   BMI 21.23 kg/m    EKG Interpretation  Date/Time:  Sunday July 02 2020 02:47:56 EST Ventricular Rate:  40 PR Interval:    QRS Duration: 114 QT Interval:  470 QTC Calculation: 384 R Axis:   75 Text Interpretation: Junctional rhythm paced complex Incomplete right bundle branch block Anteroseptal infarct, old Repol abnrm, global ischemia, diffuse leads Confirmed by Randal Buba, April ([id number]) on 07/02/2020 3:01:53 AM        ED Course/Procedures     Procedures  MDM  \ Problem List Items Addressed This Visit   None   Visit Diagnoses    Sinus pause    -  Primary   Relevant Medications   ELIQUIS 2.5 MG TABS tablet   atorvastatin (LIPITOR) 20 MG tablet   furosemide (LASIX) 20 MG tablet   metoprolol succinate (TOPROL-XL) 50 MG 24 hr tablet   spironolactone (ALDACTONE) 25 MG tablet   aspirin 81 MG EC tablet   amLODipine (NORVASC) 2.5 MG tablet   Pacemaker complications, initial encounter                Merryl Hacker, MD 07/02/20 [phone]

## 2020-07-02 NOTE — ED Notes (Signed)
Patient discharge instructions reviewed with the patient. The patient verbalized understanding of instructions. Patient discharged. 

## 2020-07-02 NOTE — Care Management Obs Status (Signed)
[place] NOTIFICATION   Patient Details  Name: Rebecca Torres MRN: [id number] Date of Birth: 1929/09/28   Medicare Observation Status Notification Given:       Laurena Slimmer, RN 07/02/2020, 10:26 AM

## 2020-07-02 NOTE — ED Notes (Signed)
Biotronic pacemaker

## 2020-07-02 NOTE — ED Notes (Signed)
Biotronik rep at bedside for pacemaker interrogation

## 2020-07-02 NOTE — ED Triage Notes (Signed)
Brought by ems from [facility] where patient lives in independent living.  Reports she had a pacemaker placed 7 years ago due to having pauses.  Has not had any issues since until yesterday.  Reports feeling pauses.  Today had long pause and felt like she was going to pass out.

## 2020-07-02 NOTE — ED Provider Notes (Signed)
BIOTRONIK PACER INTERROGATION  Biotronik pacemaker interrogation done by [employer].  Technician reported that the device was not capturing.  Ultimately he found the best threshold to be 1.5 V at 1.0 ms (Previously at 2.5V for 0.4 ms).  He made this modification and noted good capture.  He also made adjustments to the sensitivity.   Merryl Hacker, MD 07/02/20 [phone]

## 2020-07-02 NOTE — ED Notes (Signed)
Verbal order 0.25mg  atropine

## 2020-07-02 NOTE — Progress Notes (Signed)
Progress Note  Patient Name: Rebecca Torres Date of Encounter: 07/02/2020  Primary Cardiologist: No primary care provider on file.   Subjective   Feeling better with improved pacing performance. Back to her baseline.  Inpatient Medications    Scheduled Meds: . aspirin  81 mg Oral Daily  . atorvastatin  20 mg Oral Daily  . calcium-vitamin D  1 tablet Oral BID  . levothyroxine  50 mcg Oral Q0600  . magnesium oxide  400 mg Oral Daily  . pantoprazole  40 mg Oral Daily  . [START ON 07/03/2020] vitamin B-12  100 mcg Oral Q M,W,F  . vitamin E  400 Units Oral Daily   Continuous Infusions: . heparin     PRN Meds: acetaminophen, albuterol, fluticasone, LORazepam, ondansetron (ZOFRAN) IV, zolpidem   Vital Signs    Vitals:   07/02/20 0521 07/02/20 0645 07/02/20 0700 07/02/20 0745  BP:  (!) 147/76 (!) 150/78 (!) 147/76  Pulse: 72 73 73 73  Resp: (!) 21 (!) 21 16 17   Temp:      TempSrc:      SpO2: 98% 97% 97% 99%  Weight:      Height:       No intake or output data in the 24 hours ending 07/02/20 0844 Filed Weights   07/02/20 0035 07/02/20 0247  Weight: 53.5 kg 53.5 kg    Telemetry    paced - Personally Reviewed  ECG    paced - Personally Reviewed  Physical Exam   GEN: No acute distress.   Neck: No JVD Cardiac: RRR, no murmurs, rubs, or gallops.  Respiratory: Clear to auscultation bilaterally. GI: Soft, nontender, non-distended  MS: No edema; No deformity. Neuro:  Nonfocal  Psych: Normal affect   Labs    Chemistry Recent Labs  Lab 07/02/20 0111  NA 135  K 3.9  CL 99  CO2 25  GLUCOSE 107*  BUN 24*  CREATININE 0.81  CALCIUM 8.7*  PROT 7.5  ALBUMIN 4.6  AST 41  ALT 29  ALKPHOS 222*  BILITOT 0.9  GFRNONAA >60  ANIONGAP 11     Hematology Recent Labs  Lab 07/02/20 0111  WBC 10.4  RBC 4.42  HGB 14.0  HCT 40.8  MCV 92.3  MCH 31.7  MCHC 34.3  RDW 13.4  PLT 162    Cardiac EnzymesNo results for input(s): TROPONINI in the last 168  hours. No results for input(s): TROPIPOC in the last 168 hours.   BNP Recent Labs  Lab 07/02/20 0644  BNP 680.3*     DDimer No results for input(s): DDIMER in the last 168 hours.   Radiology    DG Chest Portable 1 View  Result Date: 07/02/2020 CLINICAL DATA:  Pacemaker issue EXAM: PORTABLE CHEST 1 VIEW COMPARISON:  10/14/2019 FINDINGS: Right pacer remains in place, unchanged. Cardiomegaly. Aortic atherosclerosis. No confluent opacities or effusions. No acute bony abnormality. IMPRESSION: Cardiomegaly.  No active disease. Electronically Signed   By: Rolm Baptise M.D.   On: 07/02/2020 01:22    Cardiac Studies   Pacemaker interrogation personally reviewed V threshold 1.5V at 77ms, we have reprogrammed her device output to 3V at 32ms.  New longevity estimated at 1y42m Review of her threshold trends, the V lead has a chronically elevated capture threshold at 1-1.5V at 0.75ms but it has recently increased to near 2-2.5V at 0.62ms which has caused intermittent noncapture given the device was programmed to deliver 2.5V at 0.11ms.    Patient Profile  87 y.o. female with hx of biotronik ppm for tachy-brady who presented with pacemaker noncapture due to increased V capture threshold. CXR shows good lead position. Upon close inspection of the leads on CXR, I do not see any obvious fractures to the lead components. Sensing and impedance on the ventricular lead are stable. She is back to her baseline. With the current reprogramming of her pacemaker output, I think she is safe to discharge with very close follow up in the [place] clinic. We will provide our contact information for [facility] EP clinic as a backup mechanism. I would like her to be seen this week for another in person device interrogation. Precautions and guidance on when to return to ER provided to the patient.  - EP clinic follow up with [facility] or [facility] this week.  For questions or updates, please contact [place] Please consult  [email or web address] for contact info under Cardiology/STEMI.      Signed, Lars Mage, MD  07/02/2020, 8:44 AM

## 2020-07-02 NOTE — ED Notes (Signed)
Pt ambulated to bathroom 

## 2020-07-02 NOTE — Progress Notes (Signed)
ANTICOAGULATION CONSULT NOTE - Initial Consult  Pharmacy Consult for heparin Indication: atrial fibrillation  Allergies  Allergen Reactions  . Amiodarone     Other reaction(s): Other (See Comments) Depression, Retinal Deposits  . Cephalosporins Itching  . Codeine   . Lisinopril     Other reaction(s): Cough (ALLERGY/intolerance)  . Nitrofurantoin Other (See Comments)    Hair loss, cramping    Patient Measurements: Height: 5' 2.5" (158.8 cm) Weight: 53.5 kg (117 lb 15.1 oz) IBW/kg (Calculated) : 51.25 Heparin Dosing Weight: 53.5 kg  Vital Signs: Temp: 98.1 F (36.7 C) (02/06 0246) Temp Source: Oral (02/06 0246) BP: 123/60 (02/06 0515) Pulse Rate: 72 (02/06 0521)  Labs: Recent Labs    07/02/20 0111 07/02/20 0256  HGB 14.0  --   HCT 40.8  --   PLT 162  --   LABPROT 15.8*  --   INR 1.3*  --   CREATININE 0.81  --   TROPONINIHS 15 19*    Estimated Creatinine Clearance: 38.9 mL/min (by C-G formula based on SCr of 0.81 mg/dL).   Medical History: Past Medical History:  Diagnosis Date  . A-fib ([place])   . Cancer (H[facility])    breast  . CHF (congestive heart failure) ([place])   . Hypertension   . Pacemaker   . Thyroid disease     Medications:  See medication history  Assessment: 87 yo lady on eliquis PTA to start heparin while eliquis on hold. Last dose eliquis 21:00 2/5.  Hg 14, PTLC 162 Goal of Therapy:  Heparin level 0.3-0.7 units/ml aPTT 66-102 seconds Monitor platelets by anticoagulation protocol: Yes   Plan:  Start heparin drip 12 hours after last dose eliquis at 750 units/hr Check heparin level and aPTT 6-8 hours after start Daily HL and aPTT until correlate.  Daily CBC Monitor for bleeding complications  Excell Seltzer [place] 07/02/2020,6:06 AM

## 2020-11-01 ENCOUNTER — Other Ambulatory Visit: Payer: Self-pay

## 2020-11-01 DIAGNOSIS — Z1231 Encounter for screening mammogram for malignant neoplasm of breast: Secondary | ICD-10-CM

## 2020-12-29 ENCOUNTER — Ambulatory Visit: Payer: Medicare Other

## 2021-01-19 ENCOUNTER — Ambulatory Visit
Admission: RE | Admit: 2021-01-19 | Discharge: 2021-01-19 | Disposition: A | Discharge status: Home / Self Care | Payer: Medicare Other | Source: Ambulatory Visit | Attending: Family Medicine | Admitting: Family Medicine

## 2021-01-19 ENCOUNTER — Other Ambulatory Visit: Payer: Self-pay

## 2021-01-19 DIAGNOSIS — Z1231 Encounter for screening mammogram for malignant neoplasm of breast: Secondary | ICD-10-CM

## 2021-01-19 HISTORY — DX: Malignant neoplasm of unspecified site of unspecified female breast: C50.919

## 2021-01-19 HISTORY — DX: Personal history of irradiation: Z92.3

## 2021-01-29 ENCOUNTER — Other Ambulatory Visit: Payer: Self-pay

## 2021-01-29 ENCOUNTER — Emergency Department (HOSPITAL_BASED_OUTPATIENT_CLINIC_OR_DEPARTMENT_OTHER): Payer: Medicare Other

## 2021-01-29 ENCOUNTER — Encounter (HOSPITAL_BASED_OUTPATIENT_CLINIC_OR_DEPARTMENT_OTHER): Payer: Self-pay

## 2021-01-29 ENCOUNTER — Emergency Department (HOSPITAL_BASED_OUTPATIENT_CLINIC_OR_DEPARTMENT_OTHER)
Admission: EM | Admit: 2021-01-29 | Discharge: 2021-01-29 | Disposition: A | Discharge status: Home / Self Care | Payer: Medicare Other | Attending: Emergency Medicine | Admitting: Emergency Medicine

## 2021-01-29 DIAGNOSIS — Z96652 Presence of left artificial knee joint: Secondary | ICD-10-CM | POA: Diagnosis not present

## 2021-01-29 DIAGNOSIS — N390 Urinary tract infection, site not specified: Secondary | ICD-10-CM | POA: Diagnosis not present

## 2021-01-29 DIAGNOSIS — R531 Weakness: Secondary | ICD-10-CM | POA: Diagnosis present

## 2021-01-29 DIAGNOSIS — Z950 Presence of cardiac pacemaker: Secondary | ICD-10-CM | POA: Diagnosis not present

## 2021-01-29 DIAGNOSIS — Z79899 Other long term (current) drug therapy: Secondary | ICD-10-CM | POA: Diagnosis not present

## 2021-01-29 DIAGNOSIS — I110 Hypertensive heart disease with heart failure: Secondary | ICD-10-CM | POA: Diagnosis not present

## 2021-01-29 DIAGNOSIS — Z7982 Long term (current) use of aspirin: Secondary | ICD-10-CM | POA: Diagnosis not present

## 2021-01-29 DIAGNOSIS — D72829 Elevated white blood cell count, unspecified: Secondary | ICD-10-CM | POA: Insufficient documentation

## 2021-01-29 DIAGNOSIS — I509 Heart failure, unspecified: Secondary | ICD-10-CM | POA: Diagnosis not present

## 2021-01-29 DIAGNOSIS — E860 Dehydration: Secondary | ICD-10-CM | POA: Diagnosis not present

## 2021-01-29 DIAGNOSIS — Z20822 Contact with and (suspected) exposure to covid-19: Secondary | ICD-10-CM | POA: Insufficient documentation

## 2021-01-29 DIAGNOSIS — Z853 Personal history of malignant neoplasm of breast: Secondary | ICD-10-CM | POA: Diagnosis not present

## 2021-01-29 LAB — URINALYSIS, ROUTINE W REFLEX MICROSCOPIC
Bilirubin Urine: NEGATIVE
Glucose, UA: NEGATIVE mg/dL
Ketones, ur: NEGATIVE mg/dL
Nitrite: NEGATIVE
Protein, ur: >300 mg/dL — AB
Specific Gravity, Urine: 1.025 (ref 1.005–1.030)
pH: 6 (ref 5.0–8.0)

## 2021-01-29 LAB — CBC WITH DIFFERENTIAL/PLATELET
Abs Immature Granulocytes: 0.27 10*3/uL — ABNORMAL HIGH (ref 0.00–0.07)
Basophils Absolute: 0.1 10*3/uL (ref 0.0–0.1)
Basophils Relative: 0 %
Eosinophils Absolute: 0.2 10*3/uL (ref 0.0–0.5)
Eosinophils Relative: 1 %
HCT: 40.7 % (ref 36.0–46.0)
Hemoglobin: 13.8 g/dL (ref 12.0–15.0)
Immature Granulocytes: 1 %
Lymphocytes Relative: 3 %
Lymphs Abs: 0.8 10*3/uL (ref 0.7–4.0)
MCH: 31.2 pg (ref 26.0–34.0)
MCHC: 33.9 g/dL (ref 30.0–36.0)
MCV: 91.9 fL (ref 80.0–100.0)
Monocytes Absolute: 1.2 10*3/uL — ABNORMAL HIGH (ref 0.1–1.0)
Monocytes Relative: 6 %
Neutro Abs: 19.4 10*3/uL — ABNORMAL HIGH (ref 1.7–7.7)
Neutrophils Relative %: 89 %
Platelets: 163 10*3/uL (ref 150–400)
RBC: 4.43 MIL/uL (ref 3.87–5.11)
RDW: 14.2 % (ref 11.5–15.5)
WBC: 21.8 10*3/uL — ABNORMAL HIGH (ref 4.0–10.5)
nRBC: 0 % (ref 0.0–0.2)

## 2021-01-29 LAB — BASIC METABOLIC PANEL
Anion gap: 12 (ref 5–15)
BUN: 33 mg/dL — ABNORMAL HIGH (ref 8–23)
CO2: 22 mmol/L (ref 22–32)
Calcium: 9 mg/dL (ref 8.9–10.3)
Chloride: 95 mmol/L — ABNORMAL LOW (ref 98–111)
Creatinine, Ser: 1.58 mg/dL — ABNORMAL HIGH (ref 0.44–1.00)
GFR, Estimated: 31 mL/min — ABNORMAL LOW (ref >60)
Glucose, Bld: 120 mg/dL — ABNORMAL HIGH (ref 70–99)
Potassium: 4.4 mmol/L (ref 3.5–5.1)
Sodium: 129 mmol/L — ABNORMAL LOW (ref 135–145)

## 2021-01-29 LAB — RESP PANEL BY RT-PCR (FLU A&B, COVID) ARPGX2
Influenza A by PCR: NEGATIVE
Influenza B by PCR: NEGATIVE
SARS Coronavirus 2 by RT PCR: NEGATIVE

## 2021-01-29 LAB — URINALYSIS, MICROSCOPIC (REFLEX)
RBC / HPF: >50 RBC/hpf (ref 0–5)
WBC, UA: >50 WBC/hpf (ref 0–5)

## 2021-01-29 MED ORDER — SULFAMETHOXAZOLE-TRIMETHOPRIM 800-160 MG PO TABS
1.0000 | ORAL_TABLET | Freq: Once | ORAL | Status: AC
  Administered 2021-01-29: 1 via ORAL
  Filled 2021-01-29: qty 1

## 2021-01-29 MED ORDER — SULFAMETHOXAZOLE-TRIMETHOPRIM 800-160 MG PO TABS: 1.0000 | ORAL_TABLET | Freq: Two times a day (BID) | ORAL | 0 refills | Status: AC

## 2021-01-29 NOTE — ED Notes (Signed)
ED Provider at bedside. Tyrone Nine MD

## 2021-01-29 NOTE — ED Notes (Signed)
Pt transported back to [facility] by staff from facility

## 2021-01-29 NOTE — ED Provider Notes (Signed)
[facility] EMERGENCY DEPARTMENT Provider Note   CSN: [id number] Arrival date & time: 01/29/21  1531     History Chief Complaint  Patient presents with   Weakness    [place] last PM, generalized weakness, possible UTI    Rebecca Torres is a 87 y.o. female.  87 yo F with chief complaints of urinary tract infection.  Feels just like when she had 1 in the past.  Having pain with urination and increased frequency and urgency.  Going on for about 2 to 3 days now.  Normally gets her care at her skilled nursing facility but its not have availability over this holiday weekend.  She does feel like she has been having fevers and has been mildly more weak than normal.  She feels like she would be fine to go home if she had some antibiotics.  On review of systems the patient mentioned that she fell yesterday.  She felt like she got tangled up on herself and fell down.  Unsure if she struck her head.  Denies any other injury.  The history is provided by the patient and the EMS personnel.  Weakness Severity:  Moderate Onset quality:  Gradual Duration:  3 days Timing:  Constant Progression:  Worsening Chronicity:  New Relieved by:  Nothing Worsened by:  Nothing Ineffective treatments:  None tried Associated symptoms: no arthralgias, no chest pain, no dizziness, no dysuria, no fever, no headaches, no myalgias, no nausea, no shortness of breath, no urgency and no vomiting       Past Medical History:  Diagnosis Date   A-fib ([place])    Breast cancer ([place])    Cancer ([place])    breast   CHF (congestive heart failure) ([place])    Hypertension    Pacemaker    Personal history of radiation therapy    left   Thyroid disease     Patient Active Problem List   Diagnosis Date Noted   Symptomatic bradycardia 07/02/2020   Sinus pause 07/02/2020    Past Surgical History:  Procedure Laterality Date   BREAST BIOPSY Left 2020   benign   BREAST LUMPECTOMY Left 2010   BREAST SURGERY Left     lumpectomy   JOINT REPLACEMENT     left knee     OB History   No obstetric history on file.     No family history on file.  Social History   Tobacco Use   Smoking status: Former   Smokeless tobacco: Never  Substance Use Topics   Alcohol use: Never   Drug use: Never    Home Medications Prior to Admission medications   Medication Sig Start Date End Date Taking? Authorizing Provider  sulfamethoxazole-trimethoprim (BACTRIM DS) 800-160 MG tablet Take 1 tablet by mouth 2 (two) times daily for 7 days. 01/29/21 02/05/21 Yes Deno Etienne, DO  albuterol (VENTOLIN HFA) 108 (90 Base) MCG/ACT inhaler Inhale 1 puff into the lungs every 6 (six) hours as needed for wheezing or shortness of breath.    [provider]  amLODipine (NORVASC) 2.5 MG tablet Take 2.5 mg by mouth daily. 12/06/19   [provider]  aspirin 81 MG EC tablet Take 81 mg by mouth daily.    [provider]  atorvastatin (LIPITOR) 20 MG tablet Take 20 mg by mouth daily. 06/19/20   [provider]  calcium-vitamin D (OSCAL WITH D) 500-200 MG-UNIT TABS tablet Take 1 tablet by mouth 2 (two) times daily.    [provider]  ELIQUIS 2.5 MG TABS tablet Take 2.5 mg by mouth 2 (two) times daily. 06/19/20   [provider]  eszopiclone (LUNESTA) 2 MG TABS tablet Take 2 mg by mouth at bedtime as needed for sleep. 05/16/20   [provider]  fluticasone (FLONASE) 50 MCG/ACT nasal spray Place 1 spray into the nose daily as needed for allergies.    [provider]  furosemide (LASIX) 20 MG tablet Take 40 mg by mouth daily. 03/30/20   [provider]  levothyroxine (SYNTHROID) 50 MCG tablet Take 50 mcg by mouth daily before breakfast. 05/08/20   [provider]  LORazepam (ATIVAN) 0.5 MG tablet Take 0.5 mg by mouth daily as needed for anxiety. 03/09/20   [provider]  magnesium oxide (MAG-OX) 400 MG tablet Take 400 mg by mouth daily. 08/17/19    [provider]  metoprolol succinate (TOPROL-XL) 50 MG 24 hr tablet Take 50 mg by mouth 2 (two) times daily. 05/30/20   [provider]  omeprazole (PRILOSEC) 20 MG capsule Take 20 mg by mouth daily. 03/09/20   [provider]  spironolactone (ALDACTONE) 25 MG tablet Take 12.5 mg by mouth daily. 03/30/20   [provider]  vitamin B-12 (CYANOCOBALAMIN) 500 MCG tablet Take by mouth every Monday, Wednesday, and Friday.    [provider]  vitamin E 180 MG (400 UNITS) capsule Take 400 Units by mouth daily.    [provider]    Allergies    Amiodarone, Cephalosporins, Codeine, Lisinopril, and Nitrofurantoin  Review of Systems   Review of Systems  Constitutional:  Negative for chills and fever.  HENT:  Negative for congestion and rhinorrhea.   Eyes:  Negative for redness and visual disturbance.  Respiratory:  Negative for shortness of breath and wheezing.   Cardiovascular:  Negative for chest pain and palpitations.  Gastrointestinal:  Negative for nausea and vomiting.  Genitourinary:  Negative for dysuria and urgency.  Musculoskeletal:  Negative for arthralgias and myalgias.  Skin:  Negative for pallor and wound.  Neurological:  Positive for weakness. Negative for dizziness and headaches.   Physical Exam Updated Vital Signs BP (!) 155/68 (BP Location: Right Arm)   Pulse 69   Temp 98.5 F (36.9 C) (Oral)   Resp 19   Ht '5\' 2"'$  (1.575 m)   Wt 54 kg   SpO2 96%   BMI 21.77 kg/m   Physical Exam Vitals and nursing note reviewed.  Constitutional:      General: She is not in acute distress.    Appearance: She is well-developed. She is not diaphoretic.  HENT:     Head: Normocephalic and atraumatic.  Eyes:     Pupils: Pupils are equal, round, and reactive to light.  Cardiovascular:     Rate and Rhythm: Normal rate and regular rhythm.     Heart sounds: No murmur heard.   No friction rub. No gallop.  Pulmonary:     Effort: Pulmonary  effort is normal.     Breath sounds: No wheezing or rales.  Abdominal:     General: There is no distension.     Palpations: Abdomen is soft.     Tenderness: There is no abdominal tenderness.  Musculoskeletal:        General: No tenderness.     Cervical back: Normal range of motion and neck supple.     Comments: Palpated from head to toe without any noted areas of bony tenderness.  Skin:    General: Skin  is warm and dry.  Neurological:     Mental Status: She is alert and oriented to person, place, and time.  Psychiatric:        Behavior: Behavior normal.    ED Results / Procedures / Treatments   Labs (all labs ordered are listed, but only abnormal results are displayed) Labs Reviewed  CBC WITH DIFFERENTIAL/PLATELET - Abnormal; Notable for the following components:      Result Value   WBC 21.8 (*)    Neutro Abs 19.4 (*)    Monocytes Absolute 1.2 (*)    Abs Immature Granulocytes 0.27 (*)    All other components within normal limits  BASIC METABOLIC PANEL - Abnormal; Notable for the following components:   Sodium 129 (*)    Chloride 95 (*)    Glucose, Bld 120 (*)    BUN 33 (*)    Creatinine, Ser 1.58 (*)    GFR, Estimated 31 (*)    All other components within normal limits  URINALYSIS, ROUTINE W REFLEX MICROSCOPIC - Abnormal; Notable for the following components:   Color, Urine BROWN (*)    APPearance TURBID (*)    Hgb urine dipstick LARGE (*)    Protein, ur >300 (*)    Leukocytes,Ua LARGE (*)    All other components within normal limits  URINALYSIS, MICROSCOPIC (REFLEX) - Abnormal; Notable for the following components:   Bacteria, UA MANY (*)    All other components within normal limits  RESP PANEL BY RT-PCR (FLU A&B, COVID) [id number]    EKG None  Radiology CT HEAD WO CONTRAST (5MM)  Result Date: 01/29/2021 CLINICAL DATA:  Minor head trauma, history breast cancer, CHF, hypertension, atrial fibrillation, former smoker EXAM: CT HEAD WITHOUT CONTRAST TECHNIQUE:  Contiguous axial images were obtained from the base of the skull through the vertex without intravenous contrast. Sagittal and coronal MPR images reconstructed from axial data set. COMPARISON:  None FINDINGS: Brain: Generalized atrophy. Normal ventricular morphology. No midline shift or mass effect. Small vessel chronic ischemic changes of deep cerebral white matter. No intracranial hemorrhage, mass lesion, evidence of acute infarction, or extra-axial fluid collection. Vascular: No hyperdense vessels. Mild atherosclerotic calcification of internal carotid and vertebral arteries at skull base Skull: Intact Sinuses/Orbits: Clear Other: N/A IMPRESSION: Atrophy with small vessel chronic ischemic changes of deep cerebral white matter. No acute intracranial abnormalities. Electronically Signed   By: Lavonia Dana M.D.   On: 01/29/2021 16:53   DG Chest Port 1 View  Result Date: 01/29/2021 CLINICAL DATA:  Cough, weakness EXAM: PORTABLE CHEST 1 VIEW COMPARISON:  Portable exam 1601 hours compared to 07/02/2020 FINDINGS: RIGHT subclavian transvenous pacemaker with leads projecting over RIGHT atrium and RIGHT ventricle. Enlargement of cardiac silhouette. Mediastinal contours and pulmonary vascularity normal. Atherosclerotic calcification aorta. Lungs clear. No infiltrate, pleural effusion, or pneumothorax. Bones demineralized with note of surgical clips at the LEFT axilla. IMPRESSION: Enlargement of cardiac silhouette post pacemaker. No acute abnormalities. Aortic Atherosclerosis (ICD10-I70.0). Electronically Signed   By: Lavonia Dana M.D.   On: 01/29/2021 16:54    Procedures Procedures   Medications Ordered in ED Medications  sulfamethoxazole-trimethoprim (BACTRIM DS) 800-160 MG per tablet 1 tablet (has no administration in time range)    ED Course  I have reviewed the triage vital signs and the nursing notes.  Pertinent labs & imaging results that were available during my care of the patient were reviewed by me  and considered in my medical decision making (see chart for details).  MDM Rules/Calculators/A&P                           87 yo F with a chief complaints of urinary frequency hesitancy and dysuria.  Going on for about 72 hours now.  She is also coughing in the room and states that she is coughing a bit but that is a common thing for her.  She is also endorsing fevers though afebrile here.  Will obtain basic blood work chest x-ray and UA CT of the head.  Chest x-ray viewed by me without focal infiltrate.  Blood work with a significant leukocytosis of 21,000.  Possible dehydration with hyponatremia and hypochloremia.  Patient reassessed.  I discussed her results and offered different options.  She would like to go home on antibiotics.  She has an allergy to cephalosporins we will start her on Bactrim.  PCP follow-up.  5:08 PM:  I have discussed the diagnosis/risks/treatment options with the patient and believe the pt to be eligible for discharge home to follow-up with PCP. We also discussed returning to the ED immediately if new or worsening sx occur. We discussed the sx which are most concerning (e.g., sudden worsening pain, fever, inability to tolerate by mouth) that necessitate immediate return. Medications administered to the patient during their visit and any new prescriptions provided to the patient are listed below.  Medications given during this visit Medications  sulfamethoxazole-trimethoprim (BACTRIM DS) 800-160 MG per tablet 1 tablet (has no administration in time range)     The patient appears reasonably screen and/or stabilized for discharge and I doubt any other medical condition or other [facility] requiring further screening, evaluation, or treatment in the ED at this time prior to discharge.   Final Clinical Impression(s) / ED Diagnoses Final diagnoses:  Weakness  Dehydration  Lower urinary tract infectious disease    Rx / DC Orders ED Discharge Orders          Ordered     sulfamethoxazole-trimethoprim (BACTRIM DS) 800-160 MG tablet  2 times daily        01/29/21 1706             Deno Etienne, DO 01/29/21 1708

## 2021-01-29 NOTE — ED Notes (Signed)
Kim-pts daughter.  [phone]

## 2021-01-29 NOTE — ED Notes (Signed)
[facility] is coming to transport pt back to independent living.  [place] 45 min dtg Kim notified of dc diagnosis

## 2021-01-29 NOTE — ED Notes (Signed)
Care handoff given to [facility] @ [facility]

## 2021-01-29 NOTE — ED Notes (Signed)
graham crackers and ginger ale offered to pt

## 2021-01-29 NOTE — ED Triage Notes (Signed)
[place] last pm, generalized weakness, possible UTI

## 2021-01-29 NOTE — ED Notes (Signed)
Patient transported to CT 

## 2021-01-29 NOTE — Discharge Instructions (Addendum)
Take your antibiotic as prescribed.  Please return for any worsening.  Your lab work did suggest dehydration try to increase your fluids at home.

## 2022-01-15 IMAGING — CT CT HEAD W/O CM
3 series · 15 of 47 positions shown, 18 images · non-contrast
Comparison: None

CLINICAL DATA: Minor head trauma, history breast cancer, CHF,
hypertension, atrial fibrillation, former smoker

EXAM:
CT HEAD WITHOUT CONTRAST
TECHNIQUE: Contiguous axial images were obtained from the base of the skull
through the vertex without intravenous contrast. Sagittal and
coronal MPR images reconstructed from axial data set.

[Series 2: head wo · axial · 0.43mm/px · z∈[+1042,+1182]mm · 9 of 34 slices shown, 12 images]
[im 3/34  brain]
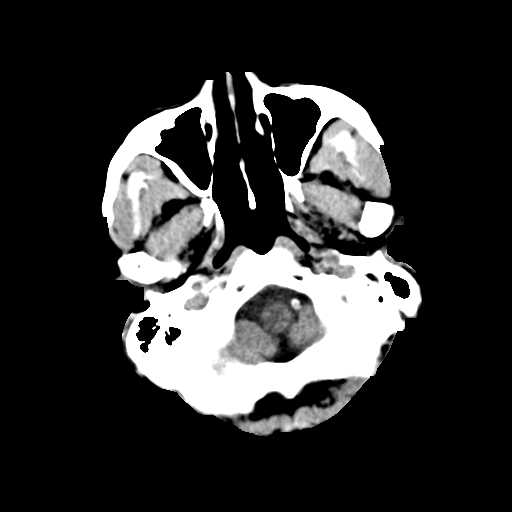
[im 3/34  bone]
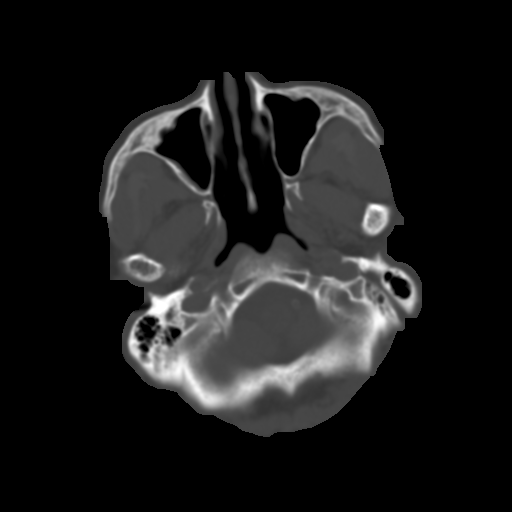
[im 6/34  brain]
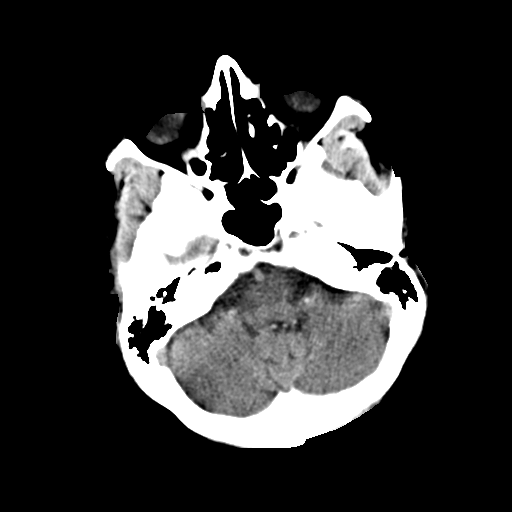
[im 10/34  brain]
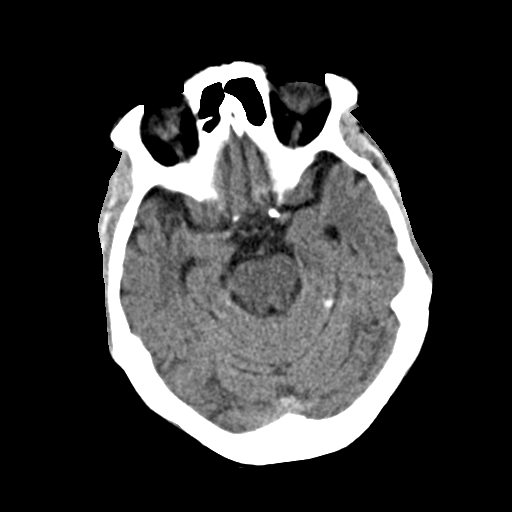
[im 13/34  brain]
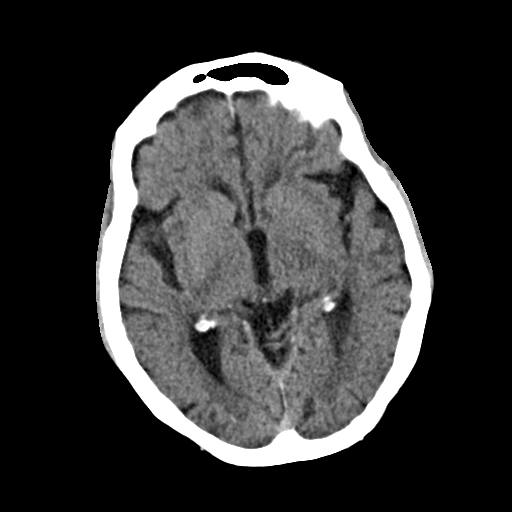
[im 18/34  brain]
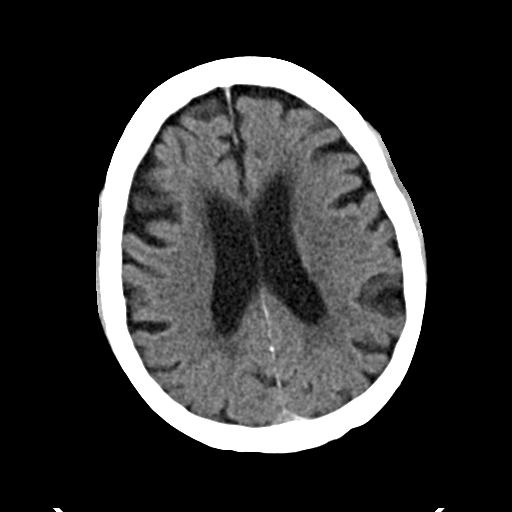
[im 18/34  bone]
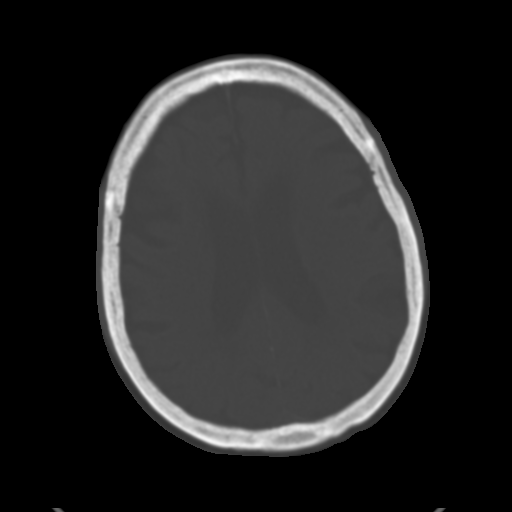
[im 21/34  brain]
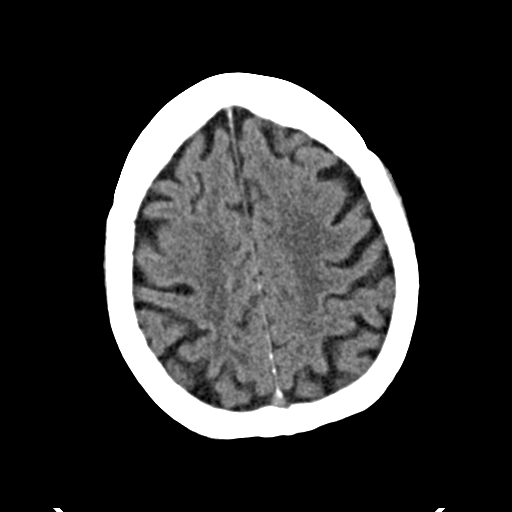
[im 24/34  brain]
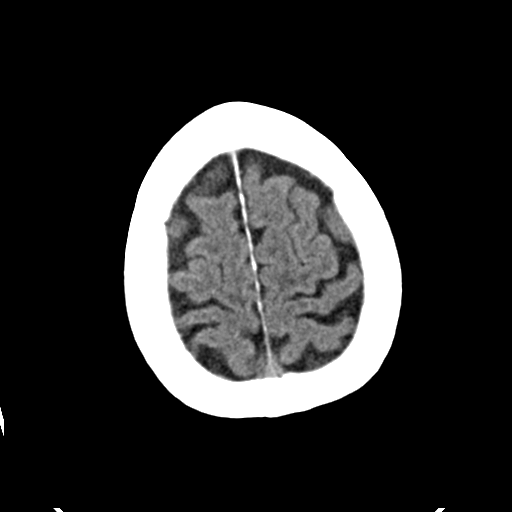
[im 28/34  brain]
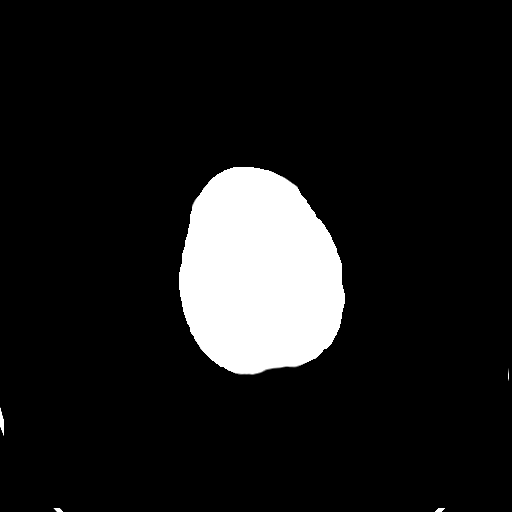
[im 31/34  brain]
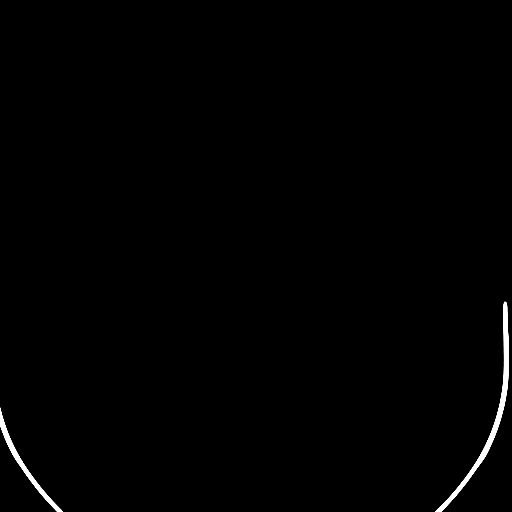
[im 31/34  bone]
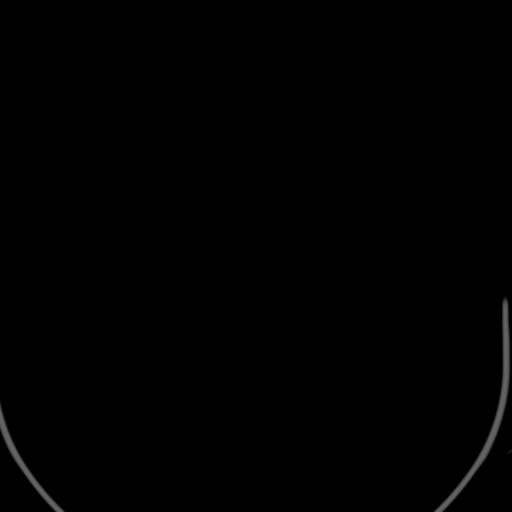

[Series 4: coronal soft · coronal · 0.33mm/px · 3 of 67 slices shown]
[im 23/67  brain]
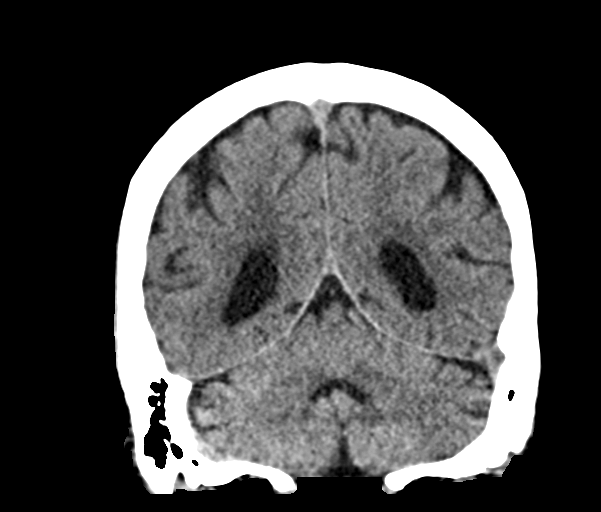
[im 30/67  brain]
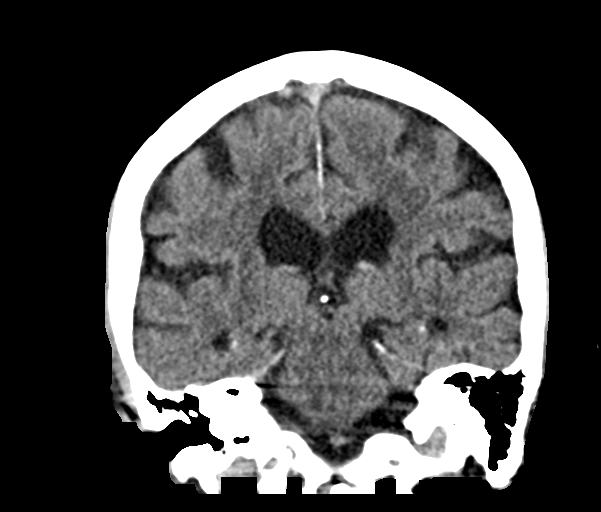
[im 37/67  brain]
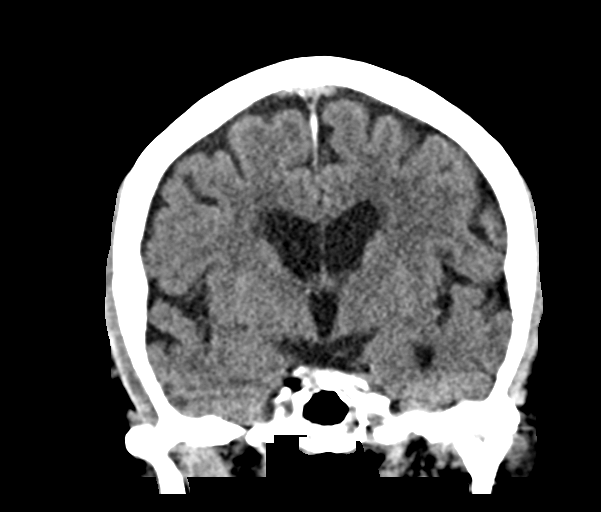

[Series 5: sag soft · sagittal · 0.33mm/px · 3 of 67 slices shown]
[im 23/67  brain]
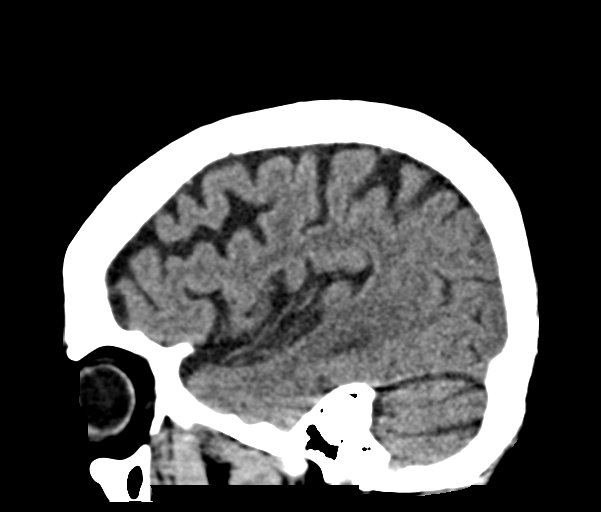
[im 34/67  brain]
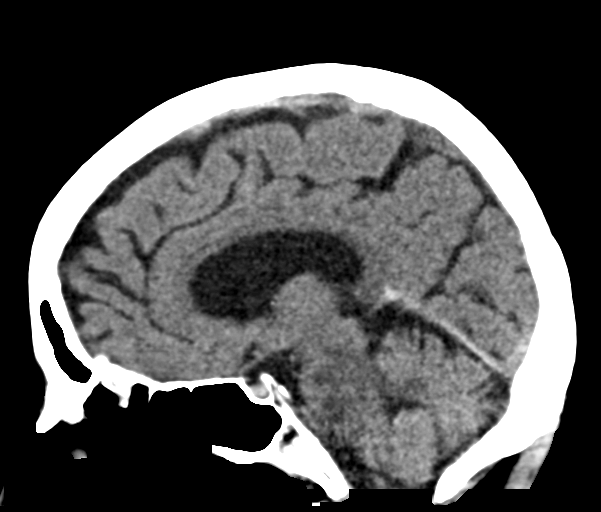
[im 45/67  brain]
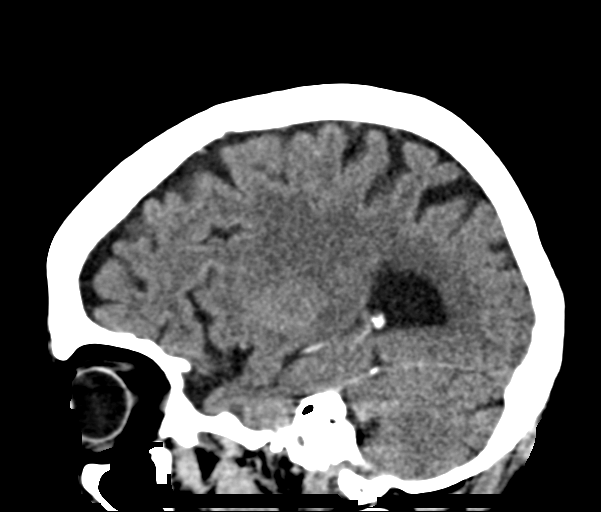

[15 of 47 positions shown; findings below may reference images not displayed]

FINDINGS: Brain: Generalized atrophy. Normal ventricular morphology. No
midline shift or mass effect. Small vessel chronic ischemic changes
of deep cerebral white matter. No intracranial hemorrhage, mass
lesion, evidence of acute infarction, or extra-axial fluid
collection.

Vascular: No hyperdense vessels. Mild atherosclerotic calcification
of internal carotid and vertebral arteries at skull base

Skull: Intact

Sinuses/Orbits: Clear

Other: N/A
IMPRESSION: Atrophy with small vessel chronic ischemic changes of deep cerebral
white matter.

No acute intracranial abnormalities.
